# Patient Record
Sex: Male | Born: 1965 | Race: Black or African American | Hispanic: No | Marital: Single | Smoking: Never smoker
Health system: Southern US, Community
[De-identification: ages and names within clinical notes are randomized; demographics above are authoritative.]

---

## 2005-11-04 ENCOUNTER — Encounter: Admission: RE | Admit: 2005-11-04 | Discharge: 2005-11-04 | Payer: Self-pay | Admitting: Occupational Medicine

## 2007-12-27 IMAGING — CR DG CHEST 1V
1 series · 1 of 1 positions shown · non-contrast
Comparison: none

CLINICAL DATA: Annual physical

Chest one view:
No previous for comparison. The heart size and mediastinal contours are within
normal limits.  Both lungs are clear.  The visualized skeletal structures are
unremarkable.

[view not recorded]
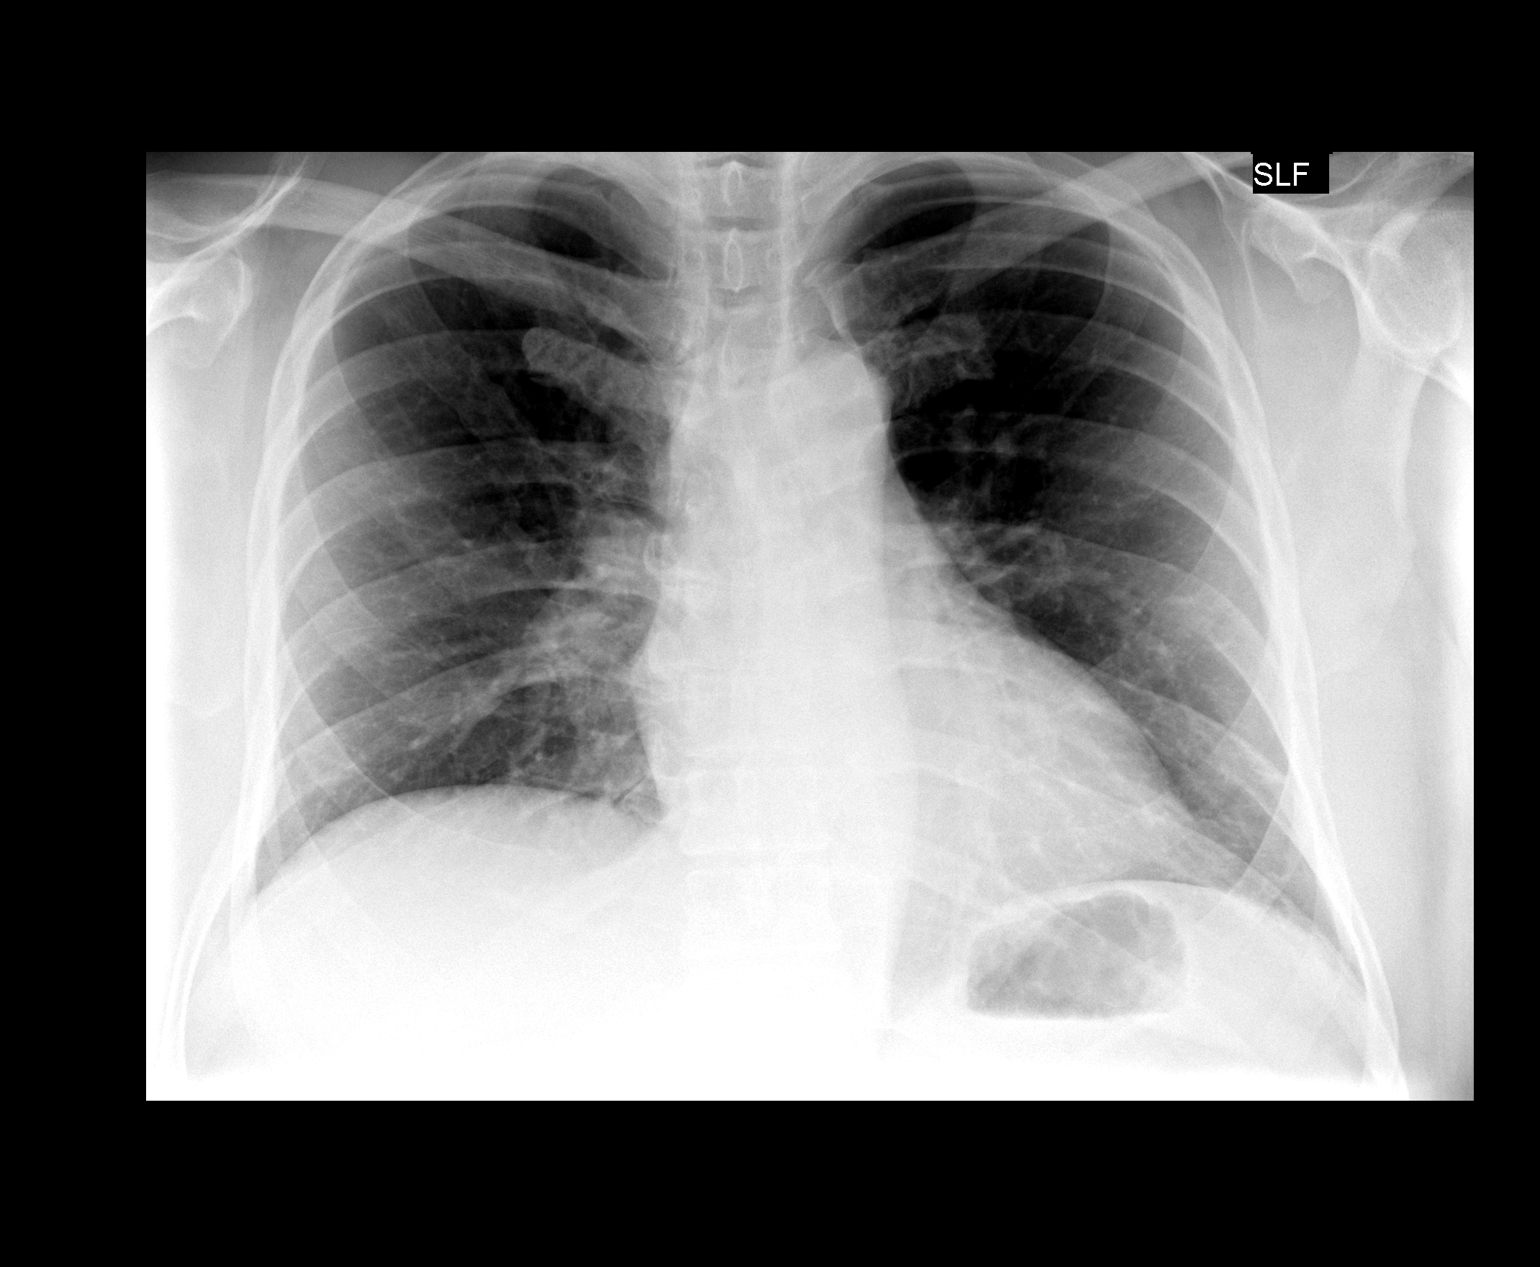

[1 of 1 positions shown; findings below may reference images not displayed]

IMPRESSION: 1. No active cardiopulmonary disease.

## 2013-10-27 ENCOUNTER — Ambulatory Visit (INDEPENDENT_AMBULATORY_CARE_PROVIDER_SITE_OTHER): Payer: BC Managed Care – PPO | Admitting: Emergency Medicine

## 2013-10-27 VITALS — BP 128/88 | HR 69 | Temp 98.3°F | Resp 16 | Ht 71.5 in | Wt 275.6 lb

## 2013-10-27 DIAGNOSIS — Z125 Encounter for screening for malignant neoplasm of prostate: Secondary | ICD-10-CM

## 2013-10-27 DIAGNOSIS — Z0000 Encounter for general adult medical examination without abnormal findings: Secondary | ICD-10-CM

## 2013-10-27 LAB — LIPID PANEL
CHOL/HDL RATIO: 4.3 ratio
CHOLESTEROL: 198 mg/dL (ref 0–200)
HDL: 46 mg/dL (ref >39)
LDL CALC: 132 mg/dL — AB (ref 0–99)
Triglycerides: 99 mg/dL (ref <150)
VLDL: 20 mg/dL (ref 0–40)

## 2013-10-27 LAB — POCT UA - MICROSCOPIC ONLY
BACTERIA, U MICROSCOPIC: NEGATIVE
CRYSTALS, UR, HPF, POC: NEGATIVE
Casts, Ur, LPF, POC: NEGATIVE
MUCUS UA: NEGATIVE
RBC, urine, microscopic: NEGATIVE
WBC, UR, HPF, POC: NEGATIVE
Yeast, UA: NEGATIVE

## 2013-10-27 LAB — POCT URINALYSIS DIPSTICK
BILIRUBIN UA: NEGATIVE
Glucose, UA: NEGATIVE
KETONES UA: NEGATIVE
LEUKOCYTES UA: NEGATIVE
Nitrite, UA: NEGATIVE
PH UA: 5
PROTEIN UA: NEGATIVE
RBC UA: NEGATIVE
Spec Grav, UA: 1.02
Urobilinogen, UA: 0.2

## 2013-10-27 LAB — POCT CBC
Granulocyte percent: 61.7 %G (ref 37–80)
HEMATOCRIT: 49.1 % (ref 43.5–53.7)
HEMOGLOBIN: 16.3 g/dL (ref 14.1–18.1)
LYMPH, POC: 2 (ref 0.6–3.4)
MCH: 29.4 pg (ref 27–31.2)
MCHC: 33.2 g/dL (ref 31.8–35.4)
MCV: 88.5 fL (ref 80–97)
MID (cbc): 0.2 (ref 0–0.9)
MPV: 7.8 fL (ref 0–99.8)
POC Granulocyte: 3.5 (ref 2–6.9)
POC LYMPH %: 35 % (ref 10–50)
POC MID %: 3.3 % (ref 0–12)
Platelet Count, POC: 251 10*3/uL (ref 142–424)
RBC: 5.55 M/uL (ref 4.69–6.13)
RDW, POC: 14.7 %
WBC: 5.7 10*3/uL (ref 4.6–10.2)

## 2013-10-27 LAB — COMPREHENSIVE METABOLIC PANEL
ALBUMIN: 4.7 g/dL (ref 3.5–5.2)
ALK PHOS: 62 U/L (ref 39–117)
ALT: 38 U/L (ref 0–53)
AST: 29 U/L (ref 0–37)
BILIRUBIN TOTAL: 0.4 mg/dL (ref 0.2–1.2)
BUN: 15 mg/dL (ref 6–23)
CHLORIDE: 102 meq/L (ref 96–112)
CO2: 21 mEq/L (ref 19–32)
Calcium: 9.7 mg/dL (ref 8.4–10.5)
Creat: 0.9 mg/dL (ref 0.50–1.35)
GLUCOSE: 98 mg/dL (ref 70–99)
POTASSIUM: 4.3 meq/L (ref 3.5–5.3)
SODIUM: 137 meq/L (ref 135–145)
Total Protein: 8 g/dL (ref 6.0–8.3)

## 2013-10-27 NOTE — Progress Notes (Signed)
[facility] [address], [place] [id number] [phone]  Date:  10/27/2013   Name:  Eduardo PerchesKenneth K Bailey   DOB:  04/25/1965   MRN:  [id number]  PCP:  No PCP Per Patient    Chief Complaint: Annual Exam   History of Present Illness:  Eduardo Bailey is a 48 y.o. very pleasant male patient who presents with the following:  No medication or medical issues.  Needs wellness exam for insurance.  Denies other complaint or health concern today.   There are no active problems to display for this patient.   History reviewed. No pertinent past medical history.  History reviewed. No pertinent past surgical history.  History  Substance Use Topics  . Smoking status: Never Smoker   . Smokeless tobacco: Not on file  . Alcohol Use: Yes    Family History  Problem Relation Age of Onset  . Cancer Father   . Cancer Sister     No Known Allergies  Medication list has been reviewed and updated.  No current outpatient prescriptions on file prior to visit.   No current facility-administered medications on file prior to visit.    Review of Systems:  As per HPI, otherwise negative.    Physical Examination: Filed Vitals:   10/27/13 0831  BP: 128/88  Pulse: 69  Temp: 98.3 F (36.8 C)  Resp: 16   Filed Vitals:   10/27/13 0831  Height: 5' 11.5" (1.816 m)  Weight: 275 lb 9.6 oz (125.011 kg)   Body mass index is 37.91 kg/(m^2). Ideal Body Weight: Weight in (lb) to have BMI = 25: 181.4  GEN: WDWN, NAD, Non-toxic, A & O x 3 HEENT: Atraumatic, Normocephalic. Neck supple. No masses, No LAD. Ears and Nose: No external deformity. CV: RRR, No M/G/R. No JVD. No thrill. No extra heart sounds. PULM: CTA B, no wheezes, crackles, rhonchi. No retractions. No resp. distress. No accessory muscle use. ABD: S, NT, ND, +BS. No rebound. No HSM. EXTR: No c/c/e NEURO Normal gait.  PSYCH: Normally interactive. Conversant. Not depressed or anxious appearing.  Calm  demeanor.    Assessment and Plan: Wellness examination Labs pending Colonoscopy   Signed,  Phillips OdorJeffery Anderson, MD

## 2013-10-28 ENCOUNTER — Encounter: Payer: Self-pay | Admitting: Family Medicine

## 2013-10-28 LAB — PSA: PSA: 0.72 ng/mL (ref <=4.00)

## 2013-11-08 ENCOUNTER — Encounter: Payer: Self-pay | Admitting: Emergency Medicine
